# Patient Record
Sex: Male | Born: 1990 | Race: White | Hispanic: No | Marital: Single | Smoking: Never smoker
Health system: Southern US, Community
[De-identification: ages and names within clinical notes are randomized; demographics above are authoritative.]

---
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
## 2012-07-05 ENCOUNTER — Emergency Department (HOSPITAL_COMMUNITY)
Admission: EM | Admit: 2012-07-05 | Discharge: 2012-07-05 | Disposition: A | Discharge status: Home / Self Care | Payer: BC Managed Care – PPO | Attending: Emergency Medicine | Admitting: Emergency Medicine

## 2012-07-05 ENCOUNTER — Encounter (HOSPITAL_COMMUNITY): Payer: Self-pay

## 2012-07-05 DIAGNOSIS — R509 Fever, unspecified: Secondary | ICD-10-CM | POA: Insufficient documentation

## 2012-07-05 DIAGNOSIS — R1110 Vomiting, unspecified: Secondary | ICD-10-CM | POA: Insufficient documentation

## 2012-07-05 DIAGNOSIS — R002 Palpitations: Secondary | ICD-10-CM | POA: Insufficient documentation

## 2012-07-05 DIAGNOSIS — E860 Dehydration: Secondary | ICD-10-CM

## 2012-07-05 LAB — GLUCOSE, CAPILLARY: Glucose-Capillary: 85 mg/dL (ref 70–99)

## 2012-07-05 MED ORDER — SODIUM CHLORIDE 0.9 % IV BOLUS (SEPSIS)
1000.0000 mL | Freq: Once | INTRAVENOUS | Status: AC
  Administered 2012-07-05: 1000 mL via INTRAVENOUS

## 2012-07-05 NOTE — ED Provider Notes (Signed)
History   This chart was scribed for Benny Lennert, MD scribed by Magnus Sinning. The patient was seen in room APA10/APA10 at 20:20  CSN: [id number]  Arrival date & time 07/05/12  2007   Chief Complaint  Patient presents with  . Tachycardia    (Consider location/radiation/quality/duration/timing/severity/associated sxs/prior treatment) HPI Comments: Kerry Bell is a 21 y.o. male who presents to the Emergency Department complaining of constant heart palpitations with associated once occurring emesis. Patient says he started having a fever and sore throat two days ago, but provides that it was relieved this morning. He says this evening he noticed his pulse was rapid and that it was in the 90s when family checked it. Pt adds that this evening threw up after eating tonight and that his throat has felt dry. He is unsure of cause of emesis.  Patient is a 21 y.o. male presenting with vomiting and palpitations. The history is provided by the patient. No language interpreter was used.  Emesis  This is a new problem. The current episode started 1 to 2 hours ago. The problem has been gradually improving. The emesis has an appearance of stomach contents. There has been no fever. Pertinent negatives include no fever.  Palpitations  This is a new problem. The current episode started 1 to 2 hours ago. The problem occurs constantly. The problem has not changed since onset.The problem is associated with an unknown factor. Associated symptoms include vomiting. Pertinent negatives include no fever. He has tried nothing for the symptoms. The treatment provided no relief.    History reviewed. No pertinent past medical history.  History reviewed. No pertinent past surgical history.  No family history on file.  History  Substance Use Topics  . Smoking status: Never Smoker   . Smokeless tobacco: Not on file  . Alcohol Use: No      Review of Systems  Constitutional: Negative for fever.    Cardiovascular: Positive for palpitations.  Gastrointestinal: Positive for vomiting.  All other systems reviewed and are negative.    Allergies  Keflex and Penicillins  Home Medications  No current outpatient prescriptions on file.  BP 153/86  Pulse 118  Temp 98.6 F (37 C) (Oral)  Resp 20  Ht 5\' 10"  (1.778 m)  Wt 172 lb (78.019 kg)  BMI 24.68 kg/m2  SpO2 100%  Physical Exam  Nursing note and vitals reviewed. Constitutional: He is oriented to person, place, and time. He appears well-developed and well-nourished.  HENT:  Head: Normocephalic and atraumatic.  Eyes: Conjunctivae normal and EOM are normal. No scleral icterus.  Neck: Neck supple. No thyromegaly present.  Cardiovascular: Regular rhythm.  Tachycardia present.  Exam reveals no gallop and no friction rub.   No murmur heard.      Mild tachycardia  Pulmonary/Chest: No stridor. He has no wheezes. He has no rales. He exhibits no tenderness.  Abdominal: He exhibits no distension. There is no tenderness. There is no rebound.  Musculoskeletal: Normal range of motion. He exhibits no edema.  Lymphadenopathy:    He has no cervical adenopathy.  Neurological: He is oriented to person, place, and time. Coordination normal.  Skin: No rash noted. No erythema.  Psychiatric: He has a normal mood and affect. His behavior is normal.    ED Course  Procedures (including critical care time) DIAGNOSTIC STUDIES: Oxygen Saturation is 100% on room air, normal by my interpretation.    COORDINATION OF CARE: 20:21: Physical exam performed. 21:30: Patient provided lab results and  notified of intent to d/c home. Pt is agreeable.   Labs Reviewed  GLUCOSE, CAPILLARY   No results found.   No diagnosis found.    MDM   The chart was scribed for me under my direct supervision.  I personally performed the history, physical, and medical decision making and all procedures in the evaluation of this patient.Benny Lennert, MD 07/05/12 2209

## 2012-07-05 NOTE — ED Notes (Signed)
Pt c/o tachycardia and anxiety. Pt states he was recently sick but his fever broke today. However, today his "heart has been racing nonstop". Pt was mildly tachycardic on arrival.

## 2012-07-05 NOTE — ED Notes (Signed)
I was running a fever for the past couple of days. I woke up this morning and my fever had broke. My heart rate was 96-97 and that's not normal per pt. Ever since I noticed it I have been checking it every few seconds. I vomited once today per pt.

## 2017-01-14 ENCOUNTER — Ambulatory Visit (HOSPITAL_COMMUNITY)
Admission: EM | Admit: 2017-01-14 | Discharge: 2017-01-14 | Disposition: A | Discharge status: Home / Self Care | Payer: Commercial Managed Care - PPO | Attending: Family Medicine | Admitting: Family Medicine

## 2017-01-14 ENCOUNTER — Encounter (HOSPITAL_COMMUNITY): Payer: Self-pay | Admitting: Emergency Medicine

## 2017-01-14 DIAGNOSIS — R110 Nausea: Secondary | ICD-10-CM

## 2017-01-14 DIAGNOSIS — R509 Fever, unspecified: Secondary | ICD-10-CM

## 2017-01-14 DIAGNOSIS — J069 Acute upper respiratory infection, unspecified: Secondary | ICD-10-CM | POA: Diagnosis not present

## 2017-01-14 MED ORDER — PREDNISONE 10 MG (21) PO TBPK: ORAL_TABLET | Freq: Every day | ORAL | 0 refills | Status: DC

## 2017-01-14 MED ORDER — AZITHROMYCIN 250 MG PO TABS: 250.0000 mg | ORAL_TABLET | Freq: Every day | ORAL | 0 refills | Status: DC

## 2017-01-14 MED ORDER — PROMETHAZINE HCL 25 MG PO TABS: 25.0000 mg | ORAL_TABLET | Freq: Four times a day (QID) | ORAL | 0 refills | Status: DC | PRN

## 2017-01-14 NOTE — ED Provider Notes (Signed)
CSN: [id number]     Arrival date & time 01/14/17  1945 History   First MD Initiated Contact with Patient 01/14/17 2030     Chief Complaint  Patient presents with  . Fever   (Consider location/radiation/quality/duration/timing/severity/associated sxs/prior Treatment) C/o cough, congestion, sob, fever for 5 days. States that he has been taking motrin around the clock but not feeling any better. States he is coughing up thick green phlem at times. Has had some n/v x2. Denies any urinary sx.       History reviewed. No pertinent past medical history. History reviewed. No pertinent surgical history. No family history on file. Social History  Substance Use Topics  . Smoking status: Never Smoker  . Smokeless tobacco: Not on file  . Alcohol use No    Review of Systems  Constitutional: Positive for appetite change, chills, fatigue and fever.  HENT: Positive for congestion, postnasal drip, rhinorrhea and sinus pressure.   Eyes: Negative.   Respiratory: Positive for cough and shortness of breath.   Cardiovascular: Negative.   Gastrointestinal: Positive for nausea and vomiting.  Genitourinary: Negative.   Musculoskeletal: Negative.   Skin:       Hot to palpation   Neurological: Negative.     Allergies  Keflex [cephalexin] and Penicillins  Home Medications   Prior to Admission medications   Medication Sig Start Date End Date Taking? Authorizing Provider  ibuprofen (ADVIL,MOTRIN) 400 MG tablet Take 400 mg by mouth every 6 (six) hours as needed.   Yes [provider]  azithromycin (ZITHROMAX) 250 MG tablet Take 1 tablet (250 mg total) by mouth daily. Take first 2 tablets together, then 1 every day until finished. 01/14/17   Tobi BastosMitchell, Eboni Coval A, NP  predniSONE (STERAPRED UNI-PAK 21 TAB) 10 MG (21) TBPK tablet Take by mouth daily. Take 6 tabs by mouth daily  for 2 days, then 5 tabs for 2 days, then 4 tabs for 2 days, then 3 tabs for 2 days, 2 tabs for 2 days, then 1 tab by mouth  daily for 2 days 01/14/17   Tobi BastosMitchell, Antwonette Feliz A, NP  promethazine (PHENERGAN) 25 MG tablet Take 1 tablet (25 mg total) by mouth every 6 (six) hours as needed for nausea or vomiting. 01/14/17   Tobi BastosMitchell, Kaymon Denomme A, NP   Meds Ordered and Administered this Visit  Medications - No data to display  BP (!) 105/55 (BP Location: Right Arm)   Pulse (!) 120   Temp 100.2 F (37.9 C) (Oral)   Resp (!) 22   SpO2 95%  No data found.   Physical Exam  Constitutional: He appears well-developed and well-nourished.  HENT:  Right Ear: External ear normal.  Left Ear: External ear normal.  Mouth/Throat: Oropharynx is clear and moist.  Eyes: Pupils are equal, round, and reactive to light.  Neck: Normal range of motion.  Cardiovascular: Normal rate and regular rhythm.   Pulmonary/Chest: He has wheezes. He has rales.  bil lower lobes, cough nonproductive, sob,   Abdominal: Soft. Bowel sounds are normal.  Neurological: He is alert.  Skin: Skin is warm.    Urgent Care Course     Procedures (including critical care time)  Labs Review Labs Reviewed - No data to display  Imaging Review No results found.        MDM   1. Upper respiratory tract infection, unspecified type   2. Nausea   3. Fever, unspecified fever cause    Take medications as needed If symptoms are not better  within a week you will need to follow up with your pcp  Take full dose of abx Drink clear fluids  Take tylenol and motrin as needed for fever and pain    Tobi Bastos, NP 01/14/17 2044

## 2017-01-14 NOTE — Discharge Instructions (Signed)
Take medications as needed If symptoms are not better within a week you will need to follow up with your pcp  Take full dose of abx Drink clear fluids  Take tylenol and motrin as needed for fever and pain

## 2017-01-14 NOTE — ED Triage Notes (Signed)
Reports fever noticed a few hours ago.  Reports fever 102.9.  Patient has had nausea and vomiting, and diarrhea.  Patient has a cough and losing his voice

## 2020-03-08 DIAGNOSIS — E663 Overweight: Secondary | ICD-10-CM | POA: Diagnosis not present

## 2020-03-08 DIAGNOSIS — A6000 Herpesviral infection of urogenital system, unspecified: Secondary | ICD-10-CM | POA: Diagnosis not present

## 2020-03-08 DIAGNOSIS — Z6828 Body mass index (BMI) 28.0-28.9, adult: Secondary | ICD-10-CM | POA: Diagnosis not present

## 2020-04-15 DIAGNOSIS — E663 Overweight: Secondary | ICD-10-CM | POA: Diagnosis not present

## 2020-04-15 DIAGNOSIS — Z6828 Body mass index (BMI) 28.0-28.9, adult: Secondary | ICD-10-CM | POA: Diagnosis not present

## 2020-04-15 DIAGNOSIS — R253 Fasciculation: Secondary | ICD-10-CM | POA: Diagnosis not present

## 2020-05-23 DIAGNOSIS — E663 Overweight: Secondary | ICD-10-CM | POA: Diagnosis not present

## 2020-05-23 DIAGNOSIS — Z6829 Body mass index (BMI) 29.0-29.9, adult: Secondary | ICD-10-CM | POA: Diagnosis not present

## 2020-05-23 DIAGNOSIS — R253 Fasciculation: Secondary | ICD-10-CM | POA: Diagnosis not present

## 2020-06-13 DIAGNOSIS — R29898 Other symptoms and signs involving the musculoskeletal system: Secondary | ICD-10-CM | POA: Diagnosis not present

## 2020-06-13 DIAGNOSIS — Z79899 Other long term (current) drug therapy: Secondary | ICD-10-CM | POA: Diagnosis not present

## 2020-06-13 DIAGNOSIS — R253 Fasciculation: Secondary | ICD-10-CM | POA: Diagnosis not present

## 2020-06-23 DIAGNOSIS — R253 Fasciculation: Secondary | ICD-10-CM | POA: Diagnosis not present

## 2020-06-23 DIAGNOSIS — G729 Myopathy, unspecified: Secondary | ICD-10-CM | POA: Diagnosis not present

## 2020-12-29 DIAGNOSIS — S39012A Strain of muscle, fascia and tendon of lower back, initial encounter: Secondary | ICD-10-CM | POA: Diagnosis not present

## 2020-12-29 DIAGNOSIS — Z1331 Encounter for screening for depression: Secondary | ICD-10-CM | POA: Diagnosis not present

## 2020-12-29 DIAGNOSIS — E6609 Other obesity due to excess calories: Secondary | ICD-10-CM | POA: Diagnosis not present

## 2020-12-29 DIAGNOSIS — Z0001 Encounter for general adult medical examination with abnormal findings: Secondary | ICD-10-CM | POA: Diagnosis not present

## 2020-12-29 DIAGNOSIS — Z6830 Body mass index (BMI) 30.0-30.9, adult: Secondary | ICD-10-CM | POA: Diagnosis not present

## 2022-02-08 DIAGNOSIS — Z0001 Encounter for general adult medical examination with abnormal findings: Secondary | ICD-10-CM | POA: Diagnosis not present

## 2022-02-08 DIAGNOSIS — R002 Palpitations: Secondary | ICD-10-CM | POA: Diagnosis not present

## 2022-02-08 DIAGNOSIS — Z6828 Body mass index (BMI) 28.0-28.9, adult: Secondary | ICD-10-CM | POA: Diagnosis not present

## 2022-02-08 DIAGNOSIS — E663 Overweight: Secondary | ICD-10-CM | POA: Diagnosis not present

## 2022-02-14 NOTE — Progress Notes (Unsigned)
Cardiology Office Note:   Date:  02/15/2022  NAME:  Kerry Bell    MRN: [id number] DOB:  September 27, 1990   PCP:  Patient, No Pcp Per  Cardiologist:  Reatha Harps, MD  Electrophysiologist:  None   Referring MD: Assunta Found, MD   Chief Complaint  Patient presents with   Palpitations    History of Present Illness:   Kerry Bell is a 31 y.o. male without medical history who is being seen today for the evaluation of palpitations at the request of Assunta Found, MD. he reports for the last 1 month has had episodes of palpitations.  He describes skipped heartbeats.  Reports can last seconds.  Occurring every few days.  Was occurring more frequently but has not happened in a few weeks.  He does report that it was worsened by excess alcohol intake.  He is also reduced caffeine consumption with improvement.  Denies any chest pain or trouble breathing.  Goes to the gym several days per week and does weight training.  No limitations.  EKG demonstrates sinus tachycardia.  Recent lab work from primary care physician demonstrates normal thyroid study 1.34.  Hemoglobin normal 16.0.  He does report symptoms can be worsened by anxiety and stress.  He is working on this.  No chest pain.  No palpitations.  Alcohol appears to be limited to the weekends.  He does describe binge drinking.  I advised him to reduce this.  No tobacco use.  No drug use.  He does have a family history of sudden death.  His mother died when he was 66.  Apparently she died in her 55s.  She was on numerous medications and this was attributed to that.  There is no other family members who have died suddenly.  Father is healthy.  No strong family history of heart disease other than the mother which details are unclear.  No limitations.  Symptoms have improved.  EKG sinus tachycardia.  Past Medical History: History reviewed. No pertinent past medical history.  Past Surgical History: History reviewed. No pertinent surgical  history.  Current Medications: No outpatient medications have been marked as taking for the 02/15/22 encounter (Office Visit) with O'Neal, Ronnald Ramp, MD.     Allergies:    Keflex [cephalexin] and Penicillins   Social History: Social History   Socioeconomic History   Marital status: Single    Spouse name: Not on file   Number of children: Not on file   Years of education: Not on file   Highest education level: Not on file  Occupational History   Occupation: Photographer  Tobacco Use   Smoking status: Never   Smokeless tobacco: Not on file  Substance and Sexual Activity   Alcohol use: Yes   Drug use: No   Sexual activity: Not on file  Other Topics Concern   Not on file  Social History Narrative   Not on file   Social Determinants of Health   Financial Resource Strain: Not on file  Food Insecurity: Not on file  Transportation Needs: Not on file  Physical Activity: Not on file  Stress: Not on file  Social Connections: Not on file     Family History: The patient's family history is not on file.  ROS:   All other ROS reviewed and negative. Pertinent positives noted in the HPI.     EKGs/Labs/Other Studies Reviewed:   The following studies were personally reviewed by me today:  EKG:  EKG is ordered  today.  The ekg ordered today demonstrates sinus tachycardia heart rate 101, no acute ischemic changes or evidence of infarction, and was personally reviewed by me.   Recent Labs: No results found for requested labs within last 365 days.   Recent Lipid Panel No results found for: "CHOL", "TRIG", "HDL", "CHOLHDL", "VLDL", "LDLCALC", "LDLDIRECT"  Physical Exam:   VS:  BP 136/74   Pulse (!) 101   Ht 5\' 10"  (1.778 m)   Wt 192 lb 3.2 oz (87.2 kg)   SpO2 100%   BMI 27.58 kg/m    Wt Readings from Last 3 Encounters:  02/15/22 192 lb 3.2 oz (87.2 kg)  07/05/12 172 lb (78 kg) (75 %, Z= 0.68)*   * Growth percentiles are based on CDC (Boys, 2-20 Years) data.     General: Well nourished, well developed, in no acute distress Head: Atraumatic, normal size  Eyes: PEERLA, EOMI  Neck: Supple, no JVD Endocrine: No thryomegaly Cardiac: Normal S1, S2; RRR; no murmurs, rubs, or gallops Lungs: Clear to auscultation bilaterally, no wheezing, rhonchi or rales  Abd: Soft, nontender, no hepatomegaly  Ext: No edema, pulses 2+ Musculoskeletal: No deformities, BUE and BLE strength normal and equal Skin: Warm and dry, no rashes   Neuro: Alert and oriented to person, place, time, and situation, CNII-XII grossly intact, no focal deficits  Psych: Normal mood and affect   ASSESSMENT:   Kerry Bell is a 31 y.o. male who presents for the following: 1. Palpitations   2. Tachycardia     PLAN:   1. Palpitations 2. Tachycardia -1 month of palpitations.  Symptoms worsened by caffeine consumption excess alcohol consumption and stress.  EKG demonstrates sinus tachycardia with no acute ischemic changes.  Normal CV exam.  Recent TSH is normal.  Recent hemoglobin is normal.  Suspect this is stress related however given mom's history of sudden death which details are unclear would recommend 7-day ZIO.  I did offer him an echocardiogram but he would like to hold on this.  I have also recommended stress reduction.  He will see me back as needed based on the results of the monitor.    Disposition: Return if symptoms worsen or fail to improve.  Medication Adjustments/Labs and Tests Ordered: Current medicines are reviewed at length with the patient today.  Concerns regarding medicines are outlined above.  Orders Placed This Encounter  Procedures   LONG TERM MONITOR (3-14 DAYS)   EKG 12-Lead   No orders of the defined types were placed in this encounter.   Patient Instructions  Medication Instructions:  Your Physician recommend you continue on your current medication as directed.    *If you need a refill on your cardiac medications before your next appointment,  please call your pharmacy*   Lab Work: None ordered today   Testing/Procedures: Your physician has recommended that you wear a 7 day Zio monitor.   This monitor is a medical device that records the heart's electrical activity. Doctors most often use these monitors to diagnose arrhythmias. Arrhythmias are problems with the speed or rhythm of the heartbeat. The monitor is a small device applied to your chest. You can wear one while you do your normal daily activities. While wearing this monitor if you have any symptoms to push the button and record what you felt. Once you have worn this monitor for the period of time provider prescribed (Usually 14 days), you will return the monitor device in the postage paid box. Once it is returned  they will download the data collected and provide [place] with a report which the provider will then review and we will call you with those results. Important tips:  Avoid showering during the first 24 hours of wearing the monitor. Avoid excessive sweating to help maximize wear time. Do not submerge the device, no hot tubs, and no swimming pools. Keep any lotions or oils away from the patch. After 24 hours you may shower with the patch on. Take brief showers with your back facing the shower head.  Do not remove patch once it has been placed because that will interrupt data and decrease adhesive wear time. Push the button when you have any symptoms and write down what you were feeling. Once you have completed wearing your monitor, remove and place into box which has postage paid and place in your outgoing mailbox.  If for some reason you have misplaced your box then call our office and we can provide another box and/or mail it off for you.    Follow-Up: At [facility], you and your health needs are our priority.  As part of our continuing mission to provide you with exceptional heart care, we have created designated Provider Care Teams.  These Care Teams include your  primary Cardiologist (physician) and Advanced Practice Providers (APPs -  Physician Assistants and Nurse Practitioners) who all work together to provide you with the care you need, when you need it.  We recommend signing up for the patient portal called "MyChart".  Sign up information is provided on this After Visit Summary.  MyChart is used to connect with patients for Virtual Visits (Telemedicine).  Patients are able to view lab/test results, encounter notes, upcoming appointments, etc.  Non-urgent messages can be sent to your provider as well.   To learn more about what you can do with MyChart, go to [email or web address].    Your next appointment:   As needed  The format for your next appointment:   In Person  Provider:   Reatha Harps, MD {           Signed, Lenna Gilford. Flora Lipps, MD, [facility]  [facility]  [address], [address] [place], [place] [address] [phone]  02/15/2022 12:11 PM

## 2022-02-15 ENCOUNTER — Encounter: Payer: Self-pay | Admitting: Cardiovascular Disease

## 2022-02-15 ENCOUNTER — Ambulatory Visit (INDEPENDENT_AMBULATORY_CARE_PROVIDER_SITE_OTHER): Payer: BC Managed Care – PPO

## 2022-02-15 ENCOUNTER — Ambulatory Visit: Payer: BC Managed Care – PPO | Admitting: Cardiovascular Disease

## 2022-02-15 VITALS — BP 136/74 | HR 101 | Ht 70.0 in | Wt 192.2 lb

## 2022-02-15 DIAGNOSIS — R002 Palpitations: Secondary | ICD-10-CM

## 2022-02-15 DIAGNOSIS — R000 Tachycardia, unspecified: Secondary | ICD-10-CM | POA: Diagnosis not present

## 2022-02-15 NOTE — Patient Instructions (Signed)
Medication Instructions:  Your Physician recommend you continue on your current medication as directed.    *If you need a refill on your cardiac medications before your next appointment, please call your pharmacy*   Lab Work: None ordered today   Testing/Procedures: Your physician has recommended that you wear a 7 day Zio monitor.   This monitor is a medical device that records the heart's electrical activity. Doctors most often use these monitors to diagnose arrhythmias. Arrhythmias are problems with the speed or rhythm of the heartbeat. The monitor is a small device applied to your chest. You can wear one while you do your normal daily activities. While wearing this monitor if you have any symptoms to push the button and record what you felt. Once you have worn this monitor for the period of time provider prescribed (Usually 14 days), you will return the monitor device in the postage paid box. Once it is returned they will download the data collected and provide [place] with a report which the provider will then review and we will call you with those results. Important tips:  Avoid showering during the first 24 hours of wearing the monitor. Avoid excessive sweating to help maximize wear time. Do not submerge the device, no hot tubs, and no swimming pools. Keep any lotions or oils away from the patch. After 24 hours you may shower with the patch on. Take brief showers with your back facing the shower head.  Do not remove patch once it has been placed because that will interrupt data and decrease adhesive wear time. Push the button when you have any symptoms and write down what you were feeling. Once you have completed wearing your monitor, remove and place into box which has postage paid and place in your outgoing mailbox.  If for some reason you have misplaced your box then call our office and we can provide another box and/or mail it off for you.    Follow-Up: At [facility], you and  your health needs are our priority.  As part of our continuing mission to provide you with exceptional heart care, we have created designated Provider Care Teams.  These Care Teams include your primary Cardiologist (physician) and Advanced Practice Providers (APPs -  Physician Assistants and Nurse Practitioners) who all work together to provide you with the care you need, when you need it.  We recommend signing up for the patient portal called "MyChart".  Sign up information is provided on this After Visit Summary.  MyChart is used to connect with patients for Virtual Visits (Telemedicine).  Patients are able to view lab/test results, encounter notes, upcoming appointments, etc.  Non-urgent messages can be sent to your provider as well.   To learn more about what you can do with MyChart, go to [email or web address].    Your next appointment:   As needed  The format for your next appointment:   In Person  Provider:   Reatha Harps, MD {

## 2022-02-15 NOTE — Progress Notes (Unsigned)
Enrolled for [employer] to mail a ZIO XT long term holter monitor to the patients address on file.  

## 2022-02-18 DIAGNOSIS — R002 Palpitations: Secondary | ICD-10-CM

## 2022-02-18 DIAGNOSIS — R000 Tachycardia, unspecified: Secondary | ICD-10-CM | POA: Diagnosis not present

## 2022-03-09 DIAGNOSIS — R000 Tachycardia, unspecified: Secondary | ICD-10-CM | POA: Diagnosis not present

## 2022-03-09 DIAGNOSIS — R002 Palpitations: Secondary | ICD-10-CM | POA: Diagnosis not present

## 2022-05-25 ENCOUNTER — Encounter: Payer: Self-pay | Admitting: Cardiovascular Disease

## 2022-09-09 NOTE — Progress Notes (Deleted)
Cardiology Office Note:   Date:  09/09/2022  NAME:  Kerry Bell    MRN: [id number] DOB:  1991-04-06   PCP:  Sharilyn Sites, MD  Cardiologist:  Evalina Field, MD  Electrophysiologist:  None   Referring MD: Sharilyn Sites, MD   No chief complaint on file. ***  History of Present Illness:   Kerry Bell is a 32 y.o. 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   Problem List PVCs -<1% burden   Past Medical History: No past medical history on file.  Past Surgical History: No past surgical history on file.  Current Medications: No outpatient medications have been marked as taking for the 09/11/22 encounter (Appointment) with O'Neal, Cassie Freer, MD.     Allergies:    Keflex [cephalexin] and Penicillins   Social History: Social History   Socioeconomic History   Marital status: Single    Spouse name: Not on file   Number of children: Not on file   Years of education: Not on file   Highest education level: Not on file  Occupational History   Occupation: Therapist, art  Tobacco Use   Smoking status: Never   Smokeless tobacco: Not on file  Substance and Sexual Activity   Alcohol use: Yes   Drug use: No   Sexual activity: Not on file  Other Topics Concern   Not on file  Social History Narrative   Not on file   Social Determinants of Health   Financial Resource Strain: Not on file  Food Insecurity: Not on file  Transportation Needs: Not on file  Physical Activity: Not on file  Stress: Not on file  Social Connections: Not on file     Family History: The patient's ***family history is not on file.  ROS:   All other ROS reviewed and negative. Pertinent positives noted in the HPI.     EKGs/Labs/Other Studies Reviewed:   The following studies were personally reviewed by me today:  EKG:  EKG is *** ordered today.  The ekg ordered today demonstrates ***, and was personally reviewed by me.   Recent Labs: No results found for requested labs  within last 365 days.   Recent Lipid Panel No results found for: "CHOL", "TRIG", "HDL", "CHOLHDL", "VLDL", "LDLCALC", "LDLDIRECT"  Physical Exam:   VS:  There were no vitals taken for this visit.   Wt Readings from Last 3 Encounters:  02/15/22 192 lb 3.2 oz (87.2 kg)  07/05/12 172 lb (78 kg) (75 %, Z= 0.68)*   * Growth percentiles are based on CDC (Boys, 2-20 Years) data.    General: Well nourished, well developed, in no acute distress Head: Atraumatic, normal size  Eyes: PEERLA, EOMI  Neck: Supple, no JVD Endocrine: No thryomegaly Cardiac: Normal S1, S2; RRR; no murmurs, rubs, or gallops Lungs: Clear to auscultation bilaterally, no wheezing, rhonchi or rales  Abd: Soft, nontender, no hepatomegaly  Ext: No edema, pulses 2+ Musculoskeletal: No deformities, BUE and BLE strength normal and equal Skin: Warm and dry, no rashes   Neuro: Alert and oriented to person, place, time, and situation, CNII-XII grossly intact, no focal deficits  Psych: Normal mood and affect   ASSESSMENT:   Kerry Bell is a 32 y.o. male who presents for the following: No diagnosis found.  PLAN:   There are no diagnoses linked to this encounter.  {Are you ordering a CV Procedure (e.g. stress test, cath, DCCV, TEE, etc)?   Press F2        :  [id number]  Disposition: No follow-ups on file.  Medication Adjustments/Labs and Tests Ordered: Current medicines are reviewed at length with the patient today.  Concerns regarding medicines are outlined above.  No orders of the defined types were placed in this encounter.  No orders of the defined types were placed in this encounter.   There are no Patient Instructions on file for this visit.   Time Spent with Patient: I have spent a total of *** minutes with patient reviewing hospital notes, telemetry, EKGs, labs and examining the patient as well as establishing an assessment and plan that was discussed with the patient.  > 50% of time was spent in direct  patient care.  Signed, Addison Naegeli. Audie Box, MD, [place]  [address] [place], [place] [address] [phone]  09/09/2022 9:28 AM

## 2022-09-10 ENCOUNTER — Encounter: Payer: Self-pay | Admitting: Cardiology

## 2022-09-10 NOTE — Telephone Encounter (Signed)
Error

## 2022-09-11 ENCOUNTER — Ambulatory Visit: Payer: BC Managed Care – PPO | Admitting: Cardiovascular Disease

## 2023-07-08 DIAGNOSIS — E7800 Pure hypercholesterolemia, unspecified: Secondary | ICD-10-CM | POA: Diagnosis not present

## 2023-07-08 DIAGNOSIS — Z0001 Encounter for general adult medical examination with abnormal findings: Secondary | ICD-10-CM | POA: Diagnosis not present

## 2023-07-08 DIAGNOSIS — Z6831 Body mass index (BMI) 31.0-31.9, adult: Secondary | ICD-10-CM | POA: Diagnosis not present

## 2023-07-08 DIAGNOSIS — E6609 Other obesity due to excess calories: Secondary | ICD-10-CM | POA: Diagnosis not present

## 2023-07-08 DIAGNOSIS — Z1331 Encounter for screening for depression: Secondary | ICD-10-CM | POA: Diagnosis not present

## 2023-07-08 DIAGNOSIS — E7889 Other lipoprotein metabolism disorders: Secondary | ICD-10-CM | POA: Diagnosis not present

## 2023-07-08 DIAGNOSIS — R002 Palpitations: Secondary | ICD-10-CM | POA: Diagnosis not present
